# Patient Record
Sex: Male | Born: 1985 | Race: Black or African American | Hispanic: No | Marital: Single | Smoking: Current every day smoker
Health system: Southern US, Community
[De-identification: ages and names within clinical notes are randomized; demographics above are authoritative.]

## PROBLEM LIST (undated history)

## (undated) ENCOUNTER — Emergency Department (HOSPITAL_COMMUNITY): Admission: EM | Payer: Self-pay | Source: Home / Self Care

---

## 2017-05-11 ENCOUNTER — Emergency Department (HOSPITAL_COMMUNITY): Payer: No Typology Code available for payment source

## 2017-05-11 ENCOUNTER — Emergency Department (HOSPITAL_COMMUNITY)
Admission: EM | Admit: 2017-05-11 | Discharge: 2017-05-12 | Disposition: A | Discharge status: Home / Self Care | Payer: No Typology Code available for payment source | Attending: Emergency Medicine | Admitting: Emergency Medicine

## 2017-05-11 ENCOUNTER — Encounter (HOSPITAL_COMMUNITY): Payer: Self-pay

## 2017-05-11 DIAGNOSIS — Y999 Unspecified external cause status: Secondary | ICD-10-CM | POA: Diagnosis not present

## 2017-05-11 DIAGNOSIS — M79604 Pain in right leg: Secondary | ICD-10-CM | POA: Diagnosis not present

## 2017-05-11 DIAGNOSIS — Y92410 Unspecified street and highway as the place of occurrence of the external cause: Secondary | ICD-10-CM | POA: Diagnosis not present

## 2017-05-11 DIAGNOSIS — Y9389 Activity, other specified: Secondary | ICD-10-CM | POA: Insufficient documentation

## 2017-05-11 DIAGNOSIS — F17210 Nicotine dependence, cigarettes, uncomplicated: Secondary | ICD-10-CM | POA: Diagnosis not present

## 2017-05-11 DIAGNOSIS — S0990XA Unspecified injury of head, initial encounter: Secondary | ICD-10-CM

## 2017-05-11 MED ORDER — FENTANYL CITRATE (PF) 100 MCG/2ML IJ SOLN
50.0000 ug | Freq: Once | INTRAMUSCULAR | Status: AC
  Administered 2017-05-11: 50 ug via INTRAVENOUS
  Filled 2017-05-11: qty 2

## 2017-05-11 MED ORDER — ONDANSETRON HCL 4 MG/2ML IJ SOLN
4.0000 mg | Freq: Once | INTRAMUSCULAR | Status: AC
  Administered 2017-05-11: 4 mg via INTRAVENOUS
  Filled 2017-05-11: qty 2
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
## 2017-05-11 NOTE — ED Notes (Signed)
Patient transported to CT scan . 

## 2017-05-12 ENCOUNTER — Emergency Department (HOSPITAL_COMMUNITY): Payer: No Typology Code available for payment source

## 2017-05-12 NOTE — Discharge Instructions (Signed)
You may alternate Tylenol 1000 mg every 6 hours as needed for pain and Ibuprofen 800 mg every 8 hours as needed for pain.  Please take Ibuprofen with food. ° °
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
